# Patient Record
Sex: Female | Born: 1935 | Race: White | Hispanic: No | Marital: Married
Health system: Southern US, Community
[De-identification: ages and names within clinical notes are randomized; demographics above are authoritative.]

---

## 2014-07-29 ENCOUNTER — Inpatient Hospital Stay: Payer: Self-pay | Admitting: Surgery

## 2014-07-29 LAB — CBC WITH DIFFERENTIAL/PLATELET
BASOS PCT: 0.1 %
Basophil #: 0 10*3/uL (ref 0.0–0.1)
Eosinophil #: 0.1 10*3/uL (ref 0.0–0.7)
Eosinophil %: 0.4 %
HCT: 17.6 % — ABNORMAL LOW (ref 35.0–47.0)
HGB: 4.9 g/dL — CL (ref 12.0–16.0)
LYMPHS PCT: 4.8 %
Lymphocyte #: 0.7 10*3/uL — ABNORMAL LOW (ref 1.0–3.6)
MCH: 15.6 pg — ABNORMAL LOW (ref 26.0–34.0)
MCHC: 27.7 g/dL — ABNORMAL LOW (ref 32.0–36.0)
MCV: 56 fL — ABNORMAL LOW (ref 80–100)
MONO ABS: 1.6 x10 3/mm — AB (ref 0.2–0.9)
Monocyte %: 10.3 %
NEUTROS ABS: 13.1 10*3/uL — AB (ref 1.4–6.5)
NEUTROS PCT: 84.4 %
Platelet: 738 10*3/uL — ABNORMAL HIGH (ref 150–440)
RBC: 3.13 10*6/uL — ABNORMAL LOW (ref 3.80–5.20)
RDW: 19.3 % — AB (ref 11.5–14.5)
WBC: 15.5 10*3/uL — AB (ref 3.6–11.0)

## 2014-07-29 LAB — COMPREHENSIVE METABOLIC PANEL
ALK PHOS: 81 U/L
ANION GAP: 9 (ref 7–16)
Albumin: 2.8 g/dL — ABNORMAL LOW (ref 3.4–5.0)
BILIRUBIN TOTAL: 0.4 mg/dL (ref 0.2–1.0)
BUN: 12 mg/dL (ref 7–18)
CHLORIDE: 100 mmol/L (ref 98–107)
Calcium, Total: 8.1 mg/dL — ABNORMAL LOW (ref 8.5–10.1)
Co2: 24 mmol/L (ref 21–32)
Creatinine: 0.84 mg/dL (ref 0.60–1.30)
EGFR (Non-African Amer.): >60
GLUCOSE: 114 mg/dL — AB (ref 65–99)
OSMOLALITY: 267 (ref 275–301)
Potassium: 3.9 mmol/L (ref 3.5–5.1)
SGOT(AST): 31 U/L (ref 15–37)
SGPT (ALT): 16 U/L
Sodium: 133 mmol/L — ABNORMAL LOW (ref 136–145)
TOTAL PROTEIN: 6.9 g/dL (ref 6.4–8.2)

## 2014-07-29 LAB — TROPONIN I

## 2014-07-29 LAB — LIPASE, BLOOD: LIPASE: 49 U/L — AB (ref 73–393)

## 2014-07-30 LAB — BASIC METABOLIC PANEL
Anion Gap: 9 (ref 7–16)
BUN: 12 mg/dL (ref 7–18)
CREATININE: 0.73 mg/dL (ref 0.60–1.30)
Calcium, Total: 8 mg/dL — ABNORMAL LOW (ref 8.5–10.1)
Chloride: 100 mmol/L (ref 98–107)
Co2: 25 mmol/L (ref 21–32)
EGFR (African American): >60
EGFR (Non-African Amer.): >60
Glucose: 138 mg/dL — ABNORMAL HIGH (ref 65–99)
OSMOLALITY: 270 (ref 275–301)
Potassium: 3.6 mmol/L (ref 3.5–5.1)
SODIUM: 134 mmol/L — AB (ref 136–145)

## 2014-07-30 LAB — URINALYSIS, COMPLETE
Bacteria: NONE SEEN
Bilirubin,UR: NEGATIVE
Blood: NEGATIVE
Glucose,UR: NEGATIVE mg/dL (ref 0–75)
Ketone: NEGATIVE
Leukocyte Esterase: NEGATIVE
Nitrite: NEGATIVE
PH: 5 (ref 4.5–8.0)
PROTEIN: NEGATIVE
RBC,UR: 2 /HPF (ref 0–5)
SPECIFIC GRAVITY: 1.02 (ref 1.003–1.030)
WBC UR: 7 /HPF (ref 0–5)

## 2014-07-30 LAB — CBC WITH DIFFERENTIAL/PLATELET
BASOS ABS: 0 10*3/uL (ref 0.0–0.1)
BASOS PCT: 0.1 %
EOS ABS: 0 10*3/uL (ref 0.0–0.7)
Eosinophil %: 0.1 %
HCT: 27.5 % — AB (ref 35.0–47.0)
HGB: 8.6 g/dL — AB (ref 12.0–16.0)
Lymphocyte #: 0.3 10*3/uL — ABNORMAL LOW (ref 1.0–3.6)
Lymphocyte %: 1.7 %
MCH: 21.3 pg — ABNORMAL LOW (ref 26.0–34.0)
MCHC: 31.4 g/dL — AB (ref 32.0–36.0)
MCV: 68 fL — ABNORMAL LOW (ref 80–100)
Monocyte #: 1.7 x10 3/mm — ABNORMAL HIGH (ref 0.2–0.9)
Monocyte %: 8.3 %
NEUTROS ABS: 18.3 10*3/uL — AB (ref 1.4–6.5)
Neutrophil %: 89.8 %
Platelet: 617 10*3/uL — ABNORMAL HIGH (ref 150–440)
RBC: 4.06 10*6/uL (ref 3.80–5.20)
RDW: 32.2 % — AB (ref 11.5–14.5)
WBC: 20.4 10*3/uL — ABNORMAL HIGH (ref 3.6–11.0)

## 2014-07-31 LAB — BASIC METABOLIC PANEL
Anion Gap: 8 (ref 7–16)
BUN: 7 mg/dL (ref 7–18)
CHLORIDE: 107 mmol/L (ref 98–107)
CO2: 22 mmol/L (ref 21–32)
Calcium, Total: 7.5 mg/dL — ABNORMAL LOW (ref 8.5–10.1)
Creatinine: 0.79 mg/dL (ref 0.60–1.30)
EGFR (Non-African Amer.): >60
GLUCOSE: 93 mg/dL (ref 65–99)
Osmolality: 271 (ref 275–301)
POTASSIUM: 3.8 mmol/L (ref 3.5–5.1)
SODIUM: 137 mmol/L (ref 136–145)

## 2014-07-31 LAB — CBC WITH DIFFERENTIAL/PLATELET
BASOS ABS: 0 10*3/uL (ref 0.0–0.1)
BASOS PCT: 0.1 %
Eosinophil #: 0.2 10*3/uL (ref 0.0–0.7)
Eosinophil %: 2 %
HCT: 24.5 % — ABNORMAL LOW (ref 35.0–47.0)
HGB: 7.6 g/dL — ABNORMAL LOW (ref 12.0–16.0)
LYMPHS ABS: 0.7 10*3/uL — AB (ref 1.0–3.6)
Lymphocyte %: 6.4 %
MCH: 21.3 pg — AB (ref 26.0–34.0)
MCHC: 31 g/dL — ABNORMAL LOW (ref 32.0–36.0)
MCV: 69 fL — ABNORMAL LOW (ref 80–100)
MONOS PCT: 12 %
Monocyte #: 1.3 x10 3/mm — ABNORMAL HIGH (ref 0.2–0.9)
NEUTROS ABS: 8.5 10*3/uL — AB (ref 1.4–6.5)
NEUTROS PCT: 79.5 %
Platelet: 591 10*3/uL — ABNORMAL HIGH (ref 150–440)
RBC: 3.56 10*6/uL — ABNORMAL LOW (ref 3.80–5.20)
RDW: 31.5 % — ABNORMAL HIGH (ref 11.5–14.5)
WBC: 10.7 10*3/uL (ref 3.6–11.0)

## 2014-07-31 LAB — MAGNESIUM: MAGNESIUM: 1.9 mg/dL

## 2014-08-01 LAB — CBC WITH DIFFERENTIAL/PLATELET
Basophil #: 0 10*3/uL (ref 0.0–0.1)
Basophil %: 0.4 %
Eosinophil #: 0.3 10*3/uL (ref 0.0–0.7)
Eosinophil %: 3.2 %
HCT: 25.7 % — AB (ref 35.0–47.0)
HGB: 7.9 g/dL — ABNORMAL LOW (ref 12.0–16.0)
Lymphocyte #: 0.7 10*3/uL — ABNORMAL LOW (ref 1.0–3.6)
Lymphocyte %: 6.6 %
MCH: 21.2 pg — ABNORMAL LOW (ref 26.0–34.0)
MCHC: 30.7 g/dL — ABNORMAL LOW (ref 32.0–36.0)
MCV: 69 fL — AB (ref 80–100)
MONOS PCT: 13 %
Monocyte #: 1.3 x10 3/mm — ABNORMAL HIGH (ref 0.2–0.9)
NEUTROS ABS: 7.7 10*3/uL — AB (ref 1.4–6.5)
Neutrophil %: 76.8 %
Platelet: 595 10*3/uL — ABNORMAL HIGH (ref 150–440)
RBC: 3.71 10*6/uL — ABNORMAL LOW (ref 3.80–5.20)
RDW: 31.9 % — AB (ref 11.5–14.5)
WBC: 10.1 10*3/uL (ref 3.6–11.0)

## 2014-08-01 LAB — CEA: CEA: 25.3 ng/mL — AB (ref 0.0–4.7)

## 2014-08-03 LAB — CBC WITH DIFFERENTIAL/PLATELET
Basophil #: 0 10*3/uL (ref 0.0–0.1)
Basophil %: 0.3 %
EOS ABS: 0.4 10*3/uL (ref 0.0–0.7)
EOS PCT: 4.5 %
HCT: 35.3 % (ref 35.0–47.0)
HGB: 11.1 g/dL — ABNORMAL LOW (ref 12.0–16.0)
LYMPHS PCT: 11.2 %
Lymphocyte #: 0.9 10*3/uL — ABNORMAL LOW (ref 1.0–3.6)
MCH: 23.5 pg — AB (ref 26.0–34.0)
MCHC: 31.6 g/dL — AB (ref 32.0–36.0)
MCV: 75 fL — ABNORMAL LOW (ref 80–100)
MONO ABS: 1.3 x10 3/mm — AB (ref 0.2–0.9)
MONOS PCT: 17.4 %
NEUTROS PCT: 66.6 %
Neutrophil #: 5.1 10*3/uL (ref 1.4–6.5)
Platelet: 524 10*3/uL — ABNORMAL HIGH (ref 150–440)
RBC: 4.74 10*6/uL (ref 3.80–5.20)
RDW: 31.3 % — ABNORMAL HIGH (ref 11.5–14.5)
WBC: 7.7 10*3/uL (ref 3.6–11.0)

## 2014-08-03 LAB — COMPREHENSIVE METABOLIC PANEL
Albumin: 2.4 g/dL — ABNORMAL LOW (ref 3.4–5.0)
Alkaline Phosphatase: 69 U/L
Anion Gap: 9 (ref 7–16)
BUN: 2 mg/dL — AB (ref 7–18)
Bilirubin,Total: 1.3 mg/dL — ABNORMAL HIGH (ref 0.2–1.0)
CALCIUM: 7.6 mg/dL — AB (ref 8.5–10.1)
CHLORIDE: 107 mmol/L (ref 98–107)
CO2: 24 mmol/L (ref 21–32)
Creatinine: 0.64 mg/dL (ref 0.60–1.30)
EGFR (African American): >60
EGFR (Non-African Amer.): >60
Glucose: 94 mg/dL (ref 65–99)
OSMOLALITY: 275 (ref 275–301)
POTASSIUM: 3.4 mmol/L — AB (ref 3.5–5.1)
SGOT(AST): 64 U/L — ABNORMAL HIGH (ref 15–37)
SGPT (ALT): 38 U/L
SODIUM: 140 mmol/L (ref 136–145)
TOTAL PROTEIN: 5.8 g/dL — AB (ref 6.4–8.2)

## 2014-08-03 LAB — PROTIME-INR
INR: 1.2
Prothrombin Time: 14.8 secs — ABNORMAL HIGH (ref 11.5–14.7)

## 2014-08-03 LAB — APTT: Activated PTT: 32.2 secs (ref 23.6–35.9)

## 2014-08-04 LAB — CBC WITH DIFFERENTIAL/PLATELET
Basophil #: 0.1 10*3/uL (ref 0.0–0.1)
Basophil %: 0.7 %
EOS ABS: 0.5 10*3/uL (ref 0.0–0.7)
Eosinophil %: 6 %
HCT: 37.7 % (ref 35.0–47.0)
HGB: 11.7 g/dL — AB (ref 12.0–16.0)
LYMPHS PCT: 13 %
Lymphocyte #: 1 10*3/uL (ref 1.0–3.6)
MCH: 23.5 pg — AB (ref 26.0–34.0)
MCHC: 31 g/dL — ABNORMAL LOW (ref 32.0–36.0)
MCV: 76 fL — ABNORMAL LOW (ref 80–100)
MONO ABS: 1.5 x10 3/mm — AB (ref 0.2–0.9)
Monocyte %: 20 %
NEUTROS ABS: 4.6 10*3/uL (ref 1.4–6.5)
Neutrophil %: 60.3 %
Platelet: 511 10*3/uL — ABNORMAL HIGH (ref 150–440)
RBC: 4.99 10*6/uL (ref 3.80–5.20)
RDW: 31.7 % — AB (ref 11.5–14.5)
WBC: 7.7 10*3/uL (ref 3.6–11.0)

## 2014-08-04 LAB — BASIC METABOLIC PANEL
ANION GAP: 5 — AB (ref 7–16)
BUN: 3 mg/dL — ABNORMAL LOW (ref 7–18)
CO2: 26 mmol/L (ref 21–32)
CREATININE: 0.81 mg/dL (ref 0.60–1.30)
Calcium, Total: 8.3 mg/dL — ABNORMAL LOW (ref 8.5–10.1)
Chloride: 106 mmol/L (ref 98–107)
GLUCOSE: 91 mg/dL (ref 65–99)
OSMOLALITY: 270 (ref 275–301)
Potassium: 3.8 mmol/L (ref 3.5–5.1)
Sodium: 137 mmol/L (ref 136–145)

## 2014-08-05 LAB — CBC WITH DIFFERENTIAL/PLATELET
BASOS PCT: 1 %
Basophil #: 0.1 10*3/uL (ref 0.0–0.1)
EOS PCT: 1.6 %
Eosinophil #: 0.2 10*3/uL (ref 0.0–0.7)
HCT: 37.7 % (ref 35.0–47.0)
HGB: 12.1 g/dL (ref 12.0–16.0)
LYMPHS ABS: 1 10*3/uL (ref 1.0–3.6)
Lymphocyte %: 9.5 %
MCH: 23.8 pg — ABNORMAL LOW (ref 26.0–34.0)
MCHC: 32 g/dL (ref 32.0–36.0)
MCV: 74 fL — AB (ref 80–100)
MONOS PCT: 13.2 %
Monocyte #: 1.4 x10 3/mm — ABNORMAL HIGH (ref 0.2–0.9)
NEUTROS ABS: 8.1 10*3/uL — AB (ref 1.4–6.5)
Neutrophil %: 74.7 %
PLATELETS: 472 10*3/uL — AB (ref 150–440)
RBC: 5.07 10*6/uL (ref 3.80–5.20)
RDW: 32.6 % — AB (ref 11.5–14.5)
WBC: 10.8 10*3/uL (ref 3.6–11.0)

## 2014-08-05 LAB — BASIC METABOLIC PANEL
Anion Gap: 9 (ref 7–16)
BUN: 6 mg/dL — ABNORMAL LOW (ref 7–18)
CALCIUM: 7.9 mg/dL — AB (ref 8.5–10.1)
CHLORIDE: 103 mmol/L (ref 98–107)
CREATININE: 1.23 mg/dL (ref 0.60–1.30)
Co2: 25 mmol/L (ref 21–32)
EGFR (Non-African Amer.): 45 — ABNORMAL LOW
GFR CALC AF AMER: 55 — AB
GLUCOSE: 113 mg/dL — AB (ref 65–99)
Osmolality: 272 (ref 275–301)
Potassium: 4.1 mmol/L (ref 3.5–5.1)
Sodium: 137 mmol/L (ref 136–145)

## 2014-08-08 LAB — CBC WITH DIFFERENTIAL/PLATELET
BASOS PCT: 0.5 %
Basophil #: 0 10*3/uL (ref 0.0–0.1)
EOS PCT: 8.1 %
Eosinophil #: 0.6 10*3/uL (ref 0.0–0.7)
HCT: 34.4 % — AB (ref 35.0–47.0)
HGB: 10.5 g/dL — ABNORMAL LOW (ref 12.0–16.0)
Lymphocyte #: 0.9 10*3/uL — ABNORMAL LOW (ref 1.0–3.6)
Lymphocyte %: 11.3 %
MCH: 23.4 pg — AB (ref 26.0–34.0)
MCHC: 30.6 g/dL — ABNORMAL LOW (ref 32.0–36.0)
MCV: 77 fL — AB (ref 80–100)
MONO ABS: 1.1 x10 3/mm — AB (ref 0.2–0.9)
Monocyte %: 14 %
NEUTROS PCT: 66.1 %
Neutrophil #: 5.3 10*3/uL (ref 1.4–6.5)
Platelet: 437 10*3/uL (ref 150–440)
RBC: 4.49 10*6/uL (ref 3.80–5.20)
RDW: 32.9 % — ABNORMAL HIGH (ref 11.5–14.5)
WBC: 8 10*3/uL (ref 3.6–11.0)

## 2014-08-08 LAB — COMPREHENSIVE METABOLIC PANEL
ALBUMIN: 2 g/dL — AB (ref 3.4–5.0)
Alkaline Phosphatase: 78 U/L
Anion Gap: 6 — ABNORMAL LOW (ref 7–16)
BUN: 4 mg/dL — ABNORMAL LOW (ref 7–18)
Bilirubin,Total: 0.4 mg/dL (ref 0.2–1.0)
CHLORIDE: 107 mmol/L (ref 98–107)
Calcium, Total: 8.1 mg/dL — ABNORMAL LOW (ref 8.5–10.1)
Co2: 27 mmol/L (ref 21–32)
Creatinine: 0.72 mg/dL (ref 0.60–1.30)
EGFR (Non-African Amer.): >60
Glucose: 90 mg/dL (ref 65–99)
Osmolality: 276 (ref 275–301)
POTASSIUM: 4 mmol/L (ref 3.5–5.1)
SGOT(AST): 19 U/L (ref 15–37)
SGPT (ALT): 21 U/L
SODIUM: 140 mmol/L (ref 136–145)
Total Protein: 5.2 g/dL — ABNORMAL LOW (ref 6.4–8.2)

## 2014-08-10 LAB — CBC WITH DIFFERENTIAL/PLATELET
Basophil #: 0.1 10*3/uL (ref 0.0–0.1)
Basophil %: 0.7 %
EOS ABS: 0.3 10*3/uL (ref 0.0–0.7)
EOS PCT: 3.4 %
HCT: 34.9 % — AB (ref 35.0–47.0)
HGB: 10.7 g/dL — ABNORMAL LOW (ref 12.0–16.0)
Lymphocyte #: 0.6 10*3/uL — ABNORMAL LOW (ref 1.0–3.6)
Lymphocyte %: 6.5 %
MCH: 23.8 pg — ABNORMAL LOW (ref 26.0–34.0)
MCHC: 30.7 g/dL — AB (ref 32.0–36.0)
MCV: 77 fL — ABNORMAL LOW (ref 80–100)
MONO ABS: 0.8 x10 3/mm (ref 0.2–0.9)
Monocyte %: 9.5 %
NEUTROS PCT: 79.9 %
Neutrophil #: 7.1 10*3/uL — ABNORMAL HIGH (ref 1.4–6.5)
PLATELETS: 433 10*3/uL (ref 150–440)
RBC: 4.52 10*6/uL (ref 3.80–5.20)
RDW: 33.4 % — ABNORMAL HIGH (ref 11.5–14.5)
WBC: 8.8 10*3/uL (ref 3.6–11.0)

## 2014-08-10 LAB — BASIC METABOLIC PANEL
ANION GAP: 4 — AB (ref 7–16)
BUN: 4 mg/dL — AB (ref 7–18)
CALCIUM: 8.4 mg/dL — AB (ref 8.5–10.1)
CHLORIDE: 107 mmol/L (ref 98–107)
CO2: 29 mmol/L (ref 21–32)
Creatinine: 0.71 mg/dL (ref 0.60–1.30)
EGFR (Non-African Amer.): >60
Glucose: 85 mg/dL (ref 65–99)
Osmolality: 276 (ref 275–301)
Potassium: 3.9 mmol/L (ref 3.5–5.1)
Sodium: 140 mmol/L (ref 136–145)

## 2014-08-14 LAB — CBC WITH DIFFERENTIAL/PLATELET
BASOS PCT: 0.3 %
Basophil #: 0 10*3/uL (ref 0.0–0.1)
Eosinophil #: 0.3 10*3/uL (ref 0.0–0.7)
Eosinophil %: 4.4 %
HCT: 33.4 % — ABNORMAL LOW (ref 35.0–47.0)
HGB: 10.5 g/dL — AB (ref 12.0–16.0)
Lymphocyte #: 0.8 10*3/uL — ABNORMAL LOW (ref 1.0–3.6)
Lymphocyte %: 13 %
MCH: 24.2 pg — AB (ref 26.0–34.0)
MCHC: 31.5 g/dL — ABNORMAL LOW (ref 32.0–36.0)
MCV: 77 fL — ABNORMAL LOW (ref 80–100)
Monocyte #: 0.8 x10 3/mm (ref 0.2–0.9)
Monocyte %: 12.4 %
NEUTROS ABS: 4.5 10*3/uL (ref 1.4–6.5)
Neutrophil %: 69.9 %
Platelet: 470 10*3/uL — ABNORMAL HIGH (ref 150–440)
RBC: 4.34 10*6/uL (ref 3.80–5.20)
RDW: 33 % — ABNORMAL HIGH (ref 11.5–14.5)
WBC: 6.4 10*3/uL (ref 3.6–11.0)

## 2014-08-14 LAB — CREATININE, SERUM: Creatinine: 0.65 mg/dL (ref 0.60–1.30)

## 2015-01-13 NOTE — H&P (Signed)
Subjective/Chief Complaint SBO   History of Present Illness Difficult historian, denial. Hx from daughter and son. Brought by them tonight for N/V wt loss, pain in RLQ for three years. Does not see MD. no colonoscopy FH of Lynch Syndrome?? possibly denies melena or hematochezia, denies N/V or pain but family suggests otherwise recent loss of husband does not want me to examine her, does not want surgery   Past History PMH none PSH hysterectomy, tumor removal from abd, no one is sure where it was, ovary vs, colon in 1974 does not see MD   Past Med/Surgical Hx:  Diverticulitis:   Hysterectomy:   ALLERGIES:  No Known Allergies:   Family and Social History:  Family History colon cancer mult members; poss Lynch syndrome   Social History negative tobacco, negative ETOH, ret [employer]   Review of Systems:  Fever/Chills No   Cough No   Sputum No   Abdominal Pain Yes   Diarrhea No   Constipation Yes   Nausea/Vomiting Yes   SOB/DOE No   Chest Pain No   Dysuria No   Tolerating Diet No  Nauseated  Vomiting   ROS ROS not reliable   Medications/Allergies Reviewed Medications/Allergies reviewed   Physical Exam:  GEN no acute distress   HEENT pink conjunctivae   NECK supple   RESP normal resp effort  no use of accessory muscles  rhonchi   CARD regular rate   ABD positive tenderness  no hernia  distended  minimal RLQ tenderness, no peritoneal signs. distended, tense, tympanitic   LYMPH negative neck   EXTR negative edema   SKIN normal to palpation   PSYCH alert, A+O to time, place, person, poor insight, denial   Lab Results: Hepatic:  07-Nov-15 19:06   Bilirubin, Total 0.4  Alkaline Phosphatase 81 (46-116 NOTE: New Reference Range 04/11/14)  SGPT (ALT) 16 (14-63 NOTE: New Reference Range 04/11/14)  SGOT (AST) 31  Total Protein, Serum 6.9  Albumin, Serum  2.8  Routine BB:  07-Nov-15 19:49   Crossmatch Unit 1  Ready  Crossmatch Unit 2 Ready  Crossmatch Unit 3 Issued (Result(s) reported on 29 Jul 2014 at 08:51PM.)  ABO Group + Rh Type O Positive  Antibody Screen NEGATIVE (Result(s) reported on 29 Jul 2014 at 08:38PM.)  Routine Chem:  07-Nov-15 19:06   Result Comment CBC - RESULTS VERIFIED BY REPEAT TESTING. HGB - C/DENIA ROYSTER/1925/07-29-14/RWW  - NOTIFIED OF CRITICAL VALUE  - READ-BACK PROCESS PERFORMED.  Result(s) reported on 29 Jul 2014 at 07:34PM.  Glucose, Serum  114  BUN 12  Creatinine (comp) 0.84  Sodium, Serum  133  Potassium, Serum 3.9  Chloride, Serum 100  CO2, Serum 24  Calcium (Total), Serum  8.1  Osmolality (calc) 267  eGFR (African American) >60  eGFR (Non-African American) >60 (eGFR values <59m/min/1.73 m2 may be an indication of chronic kidney disease (CKD). Calculated eGFR, using the MRDR Study equation, is useful in  patients with stable renal function. The eGFR calculation will not be reliable in acutely ill patients when serum creatinine is changing rapidly. It is not useful in patients on dialysis. The eGFR calculation may not be applicable to patients at the low and high extremes of body sizes, pregnant women, and vegetarians.)  Anion Gap 9  Lipase  49 (Result(s) reported on 29 Jul 2014 at 07:45PM.)  Cardiac:  07-Nov-15 19:06   Troponin I < 0.02 (0.00-0.05 0.05 ng/mL or less: NEGATIVE  Repeat testing  in 3-6 hrs  if clinically indicated. >0.05 ng/mL: POTENTIAL  MYOCARDIAL INJURY. Repeat  testing in 3-6 hrs if  clinically indicated. NOTE: An increase or decrease  of 30% or more on serial  testing suggests a  clinically important change)  Routine Hem:  07-Nov-15 19:06   WBC (CBC)  15.5  RBC (CBC)  3.13  Hemoglobin (CBC)  4.9  Hematocrit (CBC)  17.6  Platelet Count (CBC)  738  MCV  56  MCH  15.6  MCHC  27.7  RDW  19.3  Neutrophil % 84.4  Lymphocyte % 4.8  Monocyte % 10.3  Eosinophil % 0.4  Basophil % 0.1  Neutrophil #  13.1  Lymphocyte #   0.7  Monocyte #  1.6  Eosinophil # 0.1  Basophil # 0.0   Radiology Results: XRay:    07-Nov-15 19:19, Chest PA and Lateral  Chest PA and Lateral  REASON FOR EXAM:    epigastric pain  COMMENTS:       PROCEDURE: DXR - DXR CHEST PA (OR AP) AND LATERAL  - Jul 29 2014  7:19PM     CLINICAL DATA:  Patient is having epigastric pain, nausea and  vomiting starting 11/06. She is weak and not able to stand for long  periods. She has had nothing to eat today. Hx diverticilitis    EXAM:  CHEST  2 VIEW    COMPARISON:  None.    FINDINGS:  Cardiac silhouette is mildly enlarged. Normal mediastinal and hilar  contours.    Mildly thickened interstitial markings diffusely. No lung  consolidation or edema. No pleural effusion or pneumothorax.    Bony thorax is demineralized but intact.    Included portions of the upper abdomen show mild small bowel  dilation with multiple air-fluid levels. This may reflect an  adynamic ileus/gastroenteritis. No free air.     IMPRESSION:  1. No acute cardiopulmonary disease.  2. Mild small bowel dilation with multiple air-fluid levels.  Possible etiologies include adynamic ileus, gastroenteritis and low  grade partial obstruction.      Electronically Signed    By: Lajean Manes M.D.    On: 07/29/2014 19:31         Verified By: Lasandra Beech, M.D.,  CT:    07-Nov-15 22:32, CT Abdomen and Pelvis With Contrast  CT Abdomen and Pelvis With Contrast  REASON FOR EXAM:    (1) abd pain, leukocytosis, sv anemia; (2) abd pain,   leukocytosis, sv anemia  COMMENTS:       PROCEDURE: CT  - CT ABDOMEN / PELVIS  W  - Jul 29 2014 10:32PM     CLINICAL DATA:  Elevated white blood cell count. Low hemoglobin.  Surgical history of appendectomy and hysterectomy. Nausea and  vomiting for 2 days.    EXAM:  CT ABDOMEN AND PELVIS WITH CONTRAST    TECHNIQUE:  Multidetector CT imaging of the abdomen and pelvis was performed  using the standard protocol following  bolus administration of  intravenous contrast.    CONTRAST:  75 mL Isovue-300    COMPARISON:  None    FINDINGS:  Lower chest:  Lung bases are clear.  No pericardial fluid.    Hepatobiliary: Low-density lesion in the posterior right hepatic  lobe (segment6) measuring 8 mm on image 23, series 2. Adjacent  smaller lesion measures 6 mm on image 22. There are 2 large  gallstones within the lumen of the gallbladder measuring 10 mm each.  There is  no gallbladder inflammation.  Pancreas: Pancreas is normal.    Spleen: Normal spleen.    Adrenals/urinary tract: Adrenal glands and kidneys are normal.  Ureters and bladder are normal.    Stomach/Bowel: There is a large volume of contrast within the  stomach. Contrast refluxes through the diaphragm into a moderate  sized hiatal hernia. There is dilatation of the proximal small  bowel. There is poor progression of the oral contrast through the  small bowel. The distal small bowel is fluid-filled. Dilated loops  of small bowel measure up to 3 cm.    Within the right lower quadrant there is irregular nodular  thickening above the cecum. This is at the level of the terminal  ileum. The small bowel is dilated right up to the terminal ileum.  This thickening can be seen on coronal image 53, series 2. This  thickened tissue is ill-defined and measure approximate 2.5 x 2.5 cm  on image 53, series 2. There are 2 calcifications within the tip of  the cecum measuring 15 and 8 mm on image 55. The abnormal soft  tissue thickening extends along the medial border of the ascending  colon on image 47, series 2. There an extra luminal component to  this abnormal tissue in the right lower quadrant on image 53  measuring 2.3 cm. There is fluid in the ascending colon. The  transverse colon is difficult to define descending colon is  fluid-filled the or rectosigmoid colon is predominantly collapsed.    Abdominal aorta is normal caliber  Vascular/Lymphatic:  Abdominal aorta is normal caliber. There is no  retroperitoneal or periportal lymphadenopathy. No pelvic  lymphadenopathy. No peritoneal disease.    Reproductive: Post hysterectomy anatomy. No free fluid the pelvis  per    Musculoskeletal: No aggressive osseous lesion.    Other: No intraperitoneal free air.  No pneumatosis     IMPRESSION:  1. Mechanical small bowel obstruction with obstruction occurring at  the terminal ileum. This is not high-grade acute obstruction but  more slowly progressive.  2. Irregular thickened tissue at the terminal ileum extending along  the medial border this cecum is concerning for a neoplastic process.  Colorectal carcinoma carcinoma is primary consideration. Appendiceal  carcinoma would be a secondary consideration. Prior appendectomy.  3. Two calcifications within the cecum. Do not favor these to  represent gallstones as there is no gas in the gallbladder to  suggest gallstones ileus pattern.  4. Collapsed colon consistent with obstruction.  5. Two low-density lesion in the right hepatic lobe are  indeterminate but are concerning for hepatic metastasis in the face  of the potential neoplastic process in the cecum.  6. Management strategy may include NG tube to decompress the bowel  and colonoscopy to evaluate the cecum.  Findings conveyed Antonietta Barcelona, MDon 07/29/2014  at23:00.  Electronically Signed    By: Suzy Bouchard M.D.    On: 07/29/2014 23:01         Verified By: Rennis Golden, M.D.,    Assessment/Admission Diagnosis SBO in RLQ likely sec to colon mass, strong family hx. pt is noncompliant and in denial of any medical problems may not consent to surgery profound anemia and malnutrition with low albumin needs transfusions (started by ER MD) TPN preop? NG decompression, surgical planning VTE prophylaxis, plt 750k over one hour spent discussed with PD and ER MDs   Electronic Signatures: Florene Glen (MD)  (Signed  [phone] 23:59)  Authored: CHIEF COMPLAINT and HISTORY, PAST  MEDICAL/SURGIAL HISTORY, ALLERGIES, FAMILY AND SOCIAL HISTORY, REVIEW OF SYSTEMS, PHYSICAL EXAM, LABS, Radiology, ASSESSMENT AND PLAN   Last Updated: 07-Nov-15 23:59 by Florene Glen (MD)

## 2015-01-13 NOTE — H&P (Signed)
PATIENT NAME:  Briana Roth, Briana Roth MR#:  [id number] DATE OF BIRTH:  [birth year]  DATE OF ADMISSION:  07/29/2014  CHIEF COMPLAINT: Abdominal pain.   HISTORY OF PRESENT ILLNESS: This is an extremely poor historian. In fact, throughout the interview, she denies even having any sort of medical problems, but the "real story" is told by her son and daughter who live approximately an hour away from their mother, who recently lost her husband. According to the family the patient has been ill for about 3 years. They have a strong family history of possibly Lynch syndrome, in that they have had multiple members with colon cancer. No ovarian or breast cancer, but the son stated that he had a family tree done at one point and was told that he had some sort of syndrome and when I mentioned Lynch, he said "that's it."   When asked why the patient came to the Emergency Room tonight, she states that it was because her son brought her. Her son states it was because she had been throwing up for at least 24 hours, probably all day and all night, has not been eating very well, has been frequently constipated, has been losing weight, and has had abdominal pain. The abdominal pain has not worsened, but has been present for 3 years and the patient is holding her right lower quadrant. She does not see physicians; has never had a colonoscopy; does not take any medications. She denies melena, hematochezia, or weight loss, or nausea or vomiting, but actively vomits in front of me, and all of her story is contradicted by the son and daughter, who cannot help me with an accurate review of systems either.   Additional history suggests that the son called an ambulance and the patient refused to get in the ambulance earlier today. A second ambulance was called and she consented and was brought here basically against her wishes and tells me that she would not want to have any sort of surgery at this point or at least consider it at this point.  She is being actively transfused as seen in the ER.   PAST MEDICAL HISTORY: None, but the patient takes no medications and sees no physicians regularly at this time.   PAST SURGICAL HISTORY: Hysterectomy; unsure if her ovaries were removed. She had a subsequent operation for a "tumor," but is not sure and very vague about whether or not this was in the colon or in the ovary and the family is not sure either.   ALLERGIES: None.   MEDICATIONS: None.   FAMILY HISTORY: Suggestive of possible Lynch syndrome. Burnadette Peter was suggested by me when the son stated that he had had a family tree performed and a syndrome was identified. He recognized the word Lynch. No history of breast or ovarian cancer, but multiple members with colon cancer.   SOCIAL HISTORY: The patient does not smoke or drink. Has never been a heavy smoker or drinker and is a retired Public librarian.   REVIEW OF SYSTEMS: Performed. All 10 systems are negative with the exception that I  mentioned in the HPI, but again this review of systems is not very reliable in that the patient does not cooperate very well and the family is not able to assist with an accurate review of systems as they do not live with the patient. Unclear how much weight loss the patient has had over the last 3 years, but probably worsening in the last 6 months.   PHYSICAL EXAMINATION:  GENERAL: Chronically ill-appearing female patient in no acute distress. BMI of 21, weight 115 pounds, 62 inches tall.  VITAL SIGNS: Temperature of 99.2, pulse of 80, respirations 15, blood pressure 142/51. Pain scale of 0 to 2, was 7 on admission, and the patient is actively holding her right lower quadrant.  HEENT: No scleral icterus. Pallid mucous membranes.  NECK: No palpable neck nodes.   CHEST: Shows mild rhonchi bilaterally. No acute respiratory signs.  ABDOMEN: Distended, tympanitic, very tense, but there is no guarding, no rebound, no percussion tenderness. Minimal tenderness in  the right lower quadrant. A midline scar is noted without hernia.  EXTREMITIES: Show no edema.  NEUROLOGIC: Grossly intact.  INTEGUMENT: Shows no jaundice.   IMAGING: CT scan is personally reviewed, suggestive of a small bowel obstruction at the ileocecal valve with a probable mass involving the cecum and the retroperitoneum. KUB is also personally reviewed.   LABORATORY DATA: On admission, the patient's H and H were 4.9 and 17.6 with a platelet count of 738,000, an MCV of 56, and a white blood cell count of 15.5, and she is being actively transfused. Lipase was 49, serum albumin was 2.8 with a total protein of 6.9. Sodium was 133, calcium of 8.1, otherwise normal electrolytes.   ASSESSMENT AND PLAN: This is a very difficult historian who has been denying any abdominal symptoms, but was brought by her son in an ambulance to the Emergency Room for 3 years of abdominal pain with recent nausea, vomiting, weight loss worsening over the last few months, and abdominal pain that has been going on for several years, and no clear indication that it is any worse today than it has been for at least the recent timeframe. She is in no acute distress at this point; however, she does show signs of a small bowel obstruction, likely secondary to colon cancer. She is being actively transfused by the Emergency Room physician. I have recommended admission to the hospital with a nasogastric tube, which the patient does not want, but I suggested that it would be the only way to give her any sort of relief from this distention. It would make her feel better and, hopefully, prevent any sort of perforation if distention were to become worse.   I also discussed with she and her family the fact that she will almost certainly require surgery. She stated that she did not want any surgery now, but was unclear as to whether she would ever consent to any surgery at all. I have asked [facility] to see the patient as well and discuss with  them directly as with the Emergency Room physician this patient's care and concerns. The  family was in agreement with admission. She may require or benefit from some sort of nutritional support preoperatively because her albumin is so low, as is her hemoglobin and hematocrit. Venous thromboembolic prophylaxis will be necessary because of a high platelet count also suggestive of a possible paraneoplastic condition.   Over an hour was spent reviewing films and working with the patient and family and physicians on this patient's care, and I will discuss her with Dr. Michela PitcherEly, who will be taking care of her on the day shift.    ____________________________ Adah Salvageichard E. Excell Seltzerooper, MD rec:ts D: 07/30/2014 00:11:08 ET T: 07/30/2014 01:10:39 ET JOB#: [id number]  cc: Adah Salvageichard E. Excell Seltzerooper, MD, <Dictator> Lattie HawICHARD E Lorrie Strauch MD ELECTRONICALLY SIGNED 07/30/2014 20:11

## 2015-01-13 NOTE — Consult Note (Signed)
Brief Consult Note: Diagnosis: severe anemia.   Patient was seen by consultant.   Consult note dictated.   Comments: 78yCF w/o pmh - no pcp/follow up, p/w n/v abd pain apparently had RLQ for 2-3 years intermittently however never followed up now 1d duration N/V, non bloody emesis, abd pain - described only as pain, unable to rate pain further work up in ED revealed hgb 4.9 family/pt attest to weakness and doe for 6 months, no cp, brbpr, melena found to have sbo on CT - likely neoplastic process - pt eval/admitted by gen surgery  1. preoperative eval for SBO: once hgb corrected, she could be considered moderate risk for moderate risk surgery from cardiac standpoint. even despite hgb 4.9 has no cp or cardiac symptoms, no further testing required prior to surgery after correction of hgb  2. profound microcytic anemia: would suspect this is related to gi malignancy, had received 1 unit prbc thus far, has 1 more ordered. follow cbc, goal hgb>7  3. leukocytosis: no evidence of infection, no indication for abx 4. thrombocytosis: liekly reactive given anemia 5. vte px: hep subcutaneous per gen surg [phone].  Electronic Signatures: Jalon Squier, Cletis Athensavid K (MD)  (Signed [phone]-Nov-15 01:54)  Authored: Brief Consult Note   Last Updated: 08-Nov-15 01:54 by Wyatt HasteHower, Tykera Skates K (MD)

## 2015-01-13 NOTE — Consult Note (Signed)
PATIENT NAME:  Briana Roth, Briana Roth MR#:  [id number] DATE OF BIRTH:  1935/12/19  DATE OF CONSULTATION:  07/31/2014  REFERRING PHYSICIAN:   CONSULTING PHYSICIAN:  Ezzard StandingPaul Y. Tai Syfert, MD  REASON FOR REFERRAL: Bowel obstruction, liver metastases.   HISTORY OF PRESENT ILLNESS: The patient is [age] year old white female, who was admitted 2 nights ago with abdominal pain mainly in the right lower quadrant area. She lost her husband recently. She is an extremely poor historian. There is a possible family history of Lynch syndrome where other members have colon cancer. When she was admitted, CT scan showed probable liver metastases with distal small bowel obstruction and an abnormal cecal and ascending colon area where a tumor may be present. She has never had a colonoscopy. She was not interested initially on having one done. She did not have any gross hematochezia or melena, or weight loss; however, her history is very unreliable at this time. They did put an NG in for suction. She did not have that much drainage from the NG. She did say that she was having some liquid stools now.   PAST MEDICAL HISTORY:  She has no prior medical history. There is no medication. She does not see doctors usually. She has had a hysterectomy in the past.   ALLERGIES: She has no known drug allergies.   FAMILY HISTORY: Suggestive of Lynch syndrome. She does not smoke or drink. She lives alone.   REVIEW OF SYSTEMS: Negative because she basically denies everything. No family members are available.   PHYSICAL EXAMINATION:  GENERAL: The patient is chronically-ill appearing. She is thin and wasted.  HEAD AND NECK: Within normal limits.  CARDIAC: Reveals regular rhythm and rate.  LUNGS: Clear.  ABDOMEN: Shows somewhat distended. It is mildly tender, but more in the right lower quadrant area. There is no hepatomegaly.  EXTREMITIES: Show no edema.   LABORATORY AND DIAGNOSTIC DATA: CT scan shows distal small bowel obstruction at the ileocecal  valve with a probable mass involving the cecal area.   Her hemoglobin was only 4.9, hematocrit 17.6, platelet count 738,000. MCV 56. White count was 15.5. The rest of the electrolytes are okay.   ASSESSMENT AND PLAN: This is a patient who appears to have colon cancer with liver metastases already. Colon cancer may be causing a small bowel obstruction. The patient has microcytic anemia, probably from chronic blood loss. I discussed the possibility of doing a colonoscopy to get tissue samples; however, I am a little concerned that I may not be able to adequately prep her for us to do an adequate colonoscopy, to get to the right side of the colon and visualize the area well. I will order a two way abdominal series to see how things look. If the small bowel obstruction can be resolved, then we will try to prep her either from above or from below, although the tap water enema usually does not reach the rest of the colon very well. If the obstruction does not resolve, then she will have to go surgery eventually.   Thank you for the referral.    ____________________________ Ezzard StandingPaul Y. Bluford Kaufmannh, MD pyo:JT D: 07/31/2014 11:38:44 ET T: 07/31/2014 11:52:35 ET JOB#: [id number]  cc: Ezzard StandingPaul Y. Bluford Kaufmannh, MD, <Dictator> Ezzard StandingPAUL Y Aibhlinn Kalmar MD ELECTRONICALLY SIGNED 08/03/2014 10:06

## 2015-01-13 NOTE — Consult Note (Signed)
Pt seen and examined. Poor historian. Pt with chronic microcytic anemia, RLQ abdominal pain, and distal SB obstruction with probable cecal mass and liver mets by CT scan. Has NG for suction. No bowel movements. If obstruction is partially relieved, could try scheduling colonoscopy if patient can be adequately prepped to get tissue biopsies. However, if obstruction is not relieved, then patient will have to go to surgery eventually. Will follow. Thanks.   Electronic Signatures: Lutricia Feilh, Rodolfo Notaro (MD) (Signed on 09-Nov-15 07:53)  Authored   Last Updated: 09-Nov-15 07:56 by Lutricia Feilh, Sevanna Ballengee (MD)

## 2015-01-13 NOTE — Consult Note (Signed)
Brief Consult Note: Diagnosis: colon cancer stage 4, liver metastasis on CT.   Patient was seen by consultant.   Consult note dictated.   Comments: PATIENT SEEN 11/19, CONSULT DICTATED TODAY.Marland Kitchen.ALSO SPOKE WITH DAUGHTER, AND SON, SEPARATELY.Dellia Cloud.  [facility] APPT IN [facility] IN APPROX 3 WEEKS.  INITIAL F/U WOULD INCLUDE PET/CT AND REPEAT CEA IN APPROX 3 WEEKS. PATIENT CURRENTLY UNDECIDED RE TAKING ANY CANCER TXS, AND ALSO MAY MOVE TO [place], AND MAY SEEK F/U CARE OUT OF TOWN.  Electronic Signatures: Marin RobertsGittin, Robert G (MD)  (Signed [phone]-Nov-15 09:04)  Authored: Brief Consult Note   Last Updated: 20-Nov-15 09:04 by Marin RobertsGittin, Robert G (MD)

## 2015-01-13 NOTE — Consult Note (Signed)
PATIENT NAME:  Briana Roth, Briana Roth MR#:  [id number] DATE OF BIRTH:  Apr 11, 1936  DATE OF CONSULTATION:  07/30/2014  REFERRING PHYSICIAN:  Dr. Excell Seltzer CONSULTING PHYSICIAN:  Cletis Athens. Bernell Sigal, MD  PRIMARY CARE PHYSICIAN: None.   REASON FOR CONSULTATION: Anemia.   HISTORY OF PRESENT ILLNESS: This is a 79 year old Caucasian female who originally presented to the hospital on 07/29/2014 with symptoms of nausea, vomiting, and abdominal pain. She has no significant past medical history, as she follows with no PCP. Apparently, she has been complaining of right lower quadrant abdominal pain for 2- to 3-year duration which has been intermittent; however, never followed up for any further medical care. Now has 1- to 2-day duration of nausea, vomiting, nonbloody, nonbilious emesis with associated abdominal pain. The patient is a poor historian and was unwilling to provide much information. It was also obtained from her family who are currently at bedside. Describes the abdominal pain only as "pain." Unable to further rate pain or quantify. Workup in the Emergency Department revealed hemoglobin of 4.9 as well as a small-bowel obstruction. The patient was seen and evaluated by general surgery, who promptly admitted her to their service and asked for consult, given anemia and preoperative evaluation. The patient and family do attest that she has been having some weakness as well as dyspnea on exertion for at least 6 months in total duration; however, denies any chest pain or anginal symptoms, bright red blood per rectum or melena or lightheadedness or further shortness of breath. Currently, she has no complaints at this time other than "I don't want to talk to any more doctors."  REVIEW OF SYSTEMS: CONSTITUTIONAL: Positive for fatigue, weakness. Denies fevers, chills.  EYES: Denies blurry vision, double vision, or eye pain.  EARS, NOSE, THROAT: Denies tinnitus, ear pain, hearing loss.  RESPIRATORY: Denies cough, wheeze,  shortness of breath.  CARDIOVASCULAR: Denies chest pain, palpitations, edema.  GASTROINTESTINAL: Positive for nausea, vomiting, abdominal pain as described above. Denies any diarrhea or constipation.  GENITOURINARY: Denies dysuria or hematuria.  ENDOCRINE: Denies nocturia or thyroid problems. HEMATOLOGY AND LYMPHATIC: Denies easy bruising and bleeding.  SKIN: Denies rashes or lesions.  MUSCULOSKELETAL: Denies pain in neck, back, shoulder, knees, hips, or arthritic symptoms.  NEUROLOGIC: Denies paralysis, paresthesias.  PSYCHIATRIC: Denies anxiety or depressive symptoms.  Otherwise, full review of systems performed by me is negative.   PAST MEDICAL HISTORY: None.   SOCIAL HISTORY: No alcohol, tobacco, or drug usage.   FAMILY HISTORY: Positive for multiple family members with colon cancer.   ALLERGIES: None.   HOME MEDICATIONS: None.  PHYSICAL EXAMINATION:  VITAL SIGNS: Temperature 98.7, heart rate 80, respirations 16, blood pressure 154/57, saturating 95% on room air. Weight 52.2 kg, BMI 21.1.  GENERAL: Ill- and frail-appearing Caucasian female who is currently in no acute distress.  HEAD: Normocephalic, atraumatic.  EYES: Pupils equal, round, reactive to light. Extraocular muscles intact. No scleral icterus. Pale conjunctivae.  MOUTH: Dry mucosal membranes. Dentition intact. No abscess noted. EARS, NOSE, THROAT: Clear without exudates. No external lesions.  NECK: Supple. No thyromegaly. No nodules. No JVD.  PULMONARY: Clear to auscultation bilaterally without wheezes, rales, or rhonchi. No use of accessory muscles. Good respiratory rate.  CHEST: Nontender to palpation.  CARDIOVASCULAR: S1, S2, regular rate and rhythm. No murmurs, rubs, or gallops. No edema. Pedal pulses 2+ bilaterally. GASTROINTESTINAL: Soft. Minimal tenderness to palpation over the right lower quadrant; however, no rebound, guarding, or motion tenderness. Hypoactive bowel sounds. MUSCULOSKELETAL: No swelling,  clubbing, or edema. Range  of motion full in all extremities.  NEUROLOGIC: Cranial nerves II through XII intact. No gross focal neurological deficits. Sensation intact. Reflexes intact.  SKIN: No ulceration, lesions, rashes, or cyanosis. Skin warm and dry. Markedly pale. PSYCHIATRIC: Mood, affect blunted. She is, however, awake, alert, oriented x 3. Insight and judgment appear to be intact.   LABORATORY DATA: Sodium 133, potassium 3.9, chloride 100, bicarbonate 24, BUN 12, creatinine 0.84, glucose 114, albumin of 2.8. Otherwise, LFTs within normal limits. WBC 15.5, hemoglobin of 4.9, MCV of 56, RDW of 19.3, platelets of 738,000. Urinalysis negative for evidence of infection.  IMAGING: Chest x-ray performed reveals no acute cardiopulmonary process. CT, abdomen and pelvis, performed which reveals a mechanical small-bowel obstruction occurring at the terminal ileum; however, which appears to be slowly progressive. There is also irregular thickened tissue at the terminal ileum extending along the medial border of the cecum concerning for a neoplastic process. Also, a mention of 2 low-density lesions in the right hepatic lobe which are of indeterminate etiology; however, concerning for hepatic metastases.   ASSESSMENT AND PLAN: This is a 79 year old Caucasian female without significant past medical history, as she has no followup with any medical care for numerous years, presenting with nausea, vomiting, abdominal pain. She was found to have a small-bowel obstruction, likely neoplastic in etiology, as well as profound anemia, hemoglobin of 4.9. 1.  Preoperative evaluation for small-bowel obstruction. Once hemoglobin is corrected, she should be considered a moderate risk for moderate risk surgery from a cardiac standpoint. Despite having hemoglobin of 4.9, she has no chest pain, anginal, or cardiac symptoms. She has no evidence of a congestive heart failure, severe valvular dysfunction, or severe arrhythmias. No  further testing will be required prior to surgery after the correction of her anemia.  2.  Profound microcytic anemia with MCV in the 50s, elevated RDW. I suspect this is related to gastrointestinal malignancy with chronic bleeding. She has received 1 unit of packed red blood cells thus far, has been ordered to receive 1 more unit. Will follow CBCs. Goal hemoglobin will be greater than 7.  3.  She also has a CEA which has been ordered. Will follow up on this. 4.  Leukocytosis. No evidence of infection at this time. No indications for antibiotics.  5.  Thrombocytosis, likely reactive given her profound anemia.  6.  Venous thromboembolism prophylaxis with heparin subcutaneously per general surgery.  CODE STATUS: The patient is full code.   TIME SPENT: 45 minutes in discussing with the patient and family at bedside.    ____________________________ Cletis Athensavid K. Peirce Deveney, MD dkh:ST D: 07/30/2014 01:51:23 ET T: 07/30/2014 02:15:51 ET JOB#: [id number]  cc: Cletis Athensavid K. Natthew Marlatt, MD, <Dictator> Derrian Rodak Synetta ShadowK Bettyjane Shenoy MD ELECTRONICALLY SIGNED 07/31/2014 0:23

## 2015-01-13 NOTE — Consult Note (Signed)
Pt with large mass near hepatic flexure. Bx'd and tattooed. Large, obstructing mass in cecal area. Also, biopsied. Likely colon cancer. Will need to remove both masses. Will sign off. Thanks.  Electronic Signatures: Lutricia Feilh, Rithvik Orcutt (MD)  (Signed on 10-Nov-15 13:36)  Authored  Last Updated: 10-Nov-15 13:36 by Lutricia Feilh, Brannen Koppen (MD)

## 2015-01-13 NOTE — Op Note (Signed)
PATIENT NAME:  Briana Roth, BOYE MR#:  [id number] DATE OF BIRTH:  08-03-1936  DATE OF PROCEDURE:  08/04/2014  OPERATION PERFORMED: Ileocolectomy, with excision of a portion of the right psoas muscle and right genitofemoral nerve.   PREOPERATIVE DIAGNOSIS: Right colon cancer.   POSTOPERATIVE DIAGNOSIS: Right colon cancer, invading psoas muscle and retroperitoneum.   SURGEON: Claude Manges, MD  FIRST ASSISTANT: Hulda Marin, MD   ANESTHESIA: General.   PROCEDURE IN DETAIL: The patient was placed supine on the Operating Room table and prepped and draped in the usual sterile fashion. An incision was made in the midline and carried down through the patient's previous lower midline incision, and this was carried through the subcutaneous tissue and the linea alba and the peritoneum was entered carefully. There was a single adhesion from the tip of the omentum down to the pelvis, and this was divided with the electrocautery. There were no peritoneal implants in the pelvis and the liver was palpated bimanually and there were no large liver metastases. I did not inspect the liver visually and I did not perform an intraoperative ultrasound, however. The patient's tumor was quite bulky and it was fixed to the retroperitoneum. The hepatic flexure was taken down with the electrocautery and a Kocher maneuver. A partial Kocher maneuver was performed and the right colon and mesocolon were freed off of the duodenum up to a level of the proximal transverse colon. Here, the omentum was divided such that the vast majority of the omentum was left in the patient, but this omental division was taken up to the area of planned division. The mesocolon was divided here at the colon level and the colon was divided with the GIA stapling device, and then the mesocolonic excision was carried down to the retroperitoneum to include the right colic vessels. These were ligated with a 2-0 silk suture ligatures. The dissection was  carried down on top of Gerota's fascia to a level just below the lower pole of the right kidney and there the mesenteric shortening and the mass that was going into the retroperitoneum was quite problematic. Therefore, the terminal ileum was divided with the GIA stapling device and the terminal ileal mesentery was divided with the Harmonic scalpel and this was carried down to the retroperitoneum where retroperitoneal excision was performed because there were some apparent tumor nodules outside the main mass.  This was done in such a fashion that it was en bloc so that they were not separate specimens. The root of the mesentery was involved in the mass, but only the most dorsal portion of it and this was divided with the Harmonic scalpel leaving the remainder of the vessels to the remainder of the small intestine intact. The ileocolic vessels were encountered here and they were huge. They were divided and ligated with 2-0 silk suture ligatures. This left the specimen attached to the retroperitoneum and the dissection was begun laterally along what would have been the white line of Toldt to include retroperitoneal tissue up to the border of the psoas muscle. The ureter was identified proximally just as it emanated near the lower pole of the kidney and the right ovarian vessels were ligated and divided, but fortunately the ureter could be dissected free from the tumor mass. This required additional dissection of the second and third portions of the duodenum to mobilize it away from the mass and then the mass itself was left attached only to the psoas muscle.  Portion of the psoas muscle fascia and  a few muscle fibers were excised and the genitofemoral nerve was directly involved and a portion of that was excised as well.  The specimen was delivered off the table and hemostasis was achieved with electrocautery and then a side-to-side stapled anastomosis was performed from the antimesenteric surface of the terminal ileum  to the mid transverse colon, on a tenia coli.  This was done with a GIA stapling device and the resultant enterotomy was closed with TA-60 stapler.  A 3-0 silk seromuscular Lembert suture was placed where the 2 staple lines crossed and another was placed at the apex of the GIA staple line. The mesenteric defect was not closed. The wound was irrigated with warm normal saline and this was suctioned clear. The small intestine was replaced in its anatomic position with the omentum draped over top of it and everyone changed gowns and gloves and a new sterile field was brought to the table and an additional irrigation was performed. When sponge and needle counts were correct. The fascia was closed with a running #1 PDS suture. The subcutaneous tissue was irrigated and the skin was reapproximated with a skin stapling device.  A sterile dressing was applied. The patient tolerated the procedure well. There were no complications.      ____________________________ Claude MangesWilliam F. Westlynn Fifer, MD wfm:DT D: 08/04/2014 12:19:30 ET T: 08/04/2014 15:28:43 ET JOB#: [id number]  cc: Claude MangesWilliam F. Suhaila Troiano, MD, <Dictator> Claude MangesWILLIAM F Kalayna Noy MD ELECTRONICALLY SIGNED 08/04/2014 17:22

## 2015-01-13 NOTE — Consult Note (Signed)
Asked to schedule colonoscopy either today or tomorow. Pt apparently pulled NG out on her own. Some liquid stools. However, repeat abdominal series show persistent distal SBO. It is unlikely that I will have an adequate view of colon today without bowel prep. Tap water enemas are unlikely to clean right side of colon well. Will try some golytely prep today to see if we can prep her. Hopefully, bowel obstruction would not worsen. If we can clean her, then I will try to schedule colonoscopy by tomorrow. Thanks.   Electronic Signatures: Lutricia Feilh, Charlisa Cham (MD)  (Signed on 09-Nov-15 11:59)  Authored  Last Updated: 09-Nov-15 11:59 by Lutricia Feilh, Lorre Opdahl (MD)

## 2015-01-13 NOTE — Discharge Summary (Signed)
PATIENT NAME:  Briana Roth, Briana Roth MR#:  [id number] DATE OF BIRTH:  [birth year]  DATE OF ADMISSION:  07/29/2014 DATE OF DISCHARGE:  08/14/2014  PRINCIPLE DIAGNOSES:  1.  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  2.  Severe anemia.   PRINCIPAL PROCEDURE PERFORMED DURING THIS ADMISSION: On 08/04/2014, ileocolectomy with excision of a portion of the right psoas muscle and right genitofemoral nerve.   OTHER PROCEDURES PERFORMED DURING THIS ADMISSION: Colonoscopy on 08/01/2014.   HOSPITAL COURSE: The patient was admitted to the hospital and transfused for her presenting hemoglobin of approximately 5. She was undecided regarding surgery for quite a few days but ultimately acquiesced and underwent colonoscopy, which cleared the remainder of her colon. She had a family history that was consistent with non-polyposis hereditary colon cancer and underwent the above-mentioned procedure for the above-mentioned diagnosis and after a preoperative CEA of 25 was obtained. Her preoperative CT scan showed 2 subcentimeter lesions in segment 6 of the liver consistent with liver metastases. Her pathology returned with a 2 of 25 positive lymph nodes, a very close or positive radial margin and her stage was T4a, N1b, MX (R0).   Postoperatively she did well and her diet was advanced and she was ready for discharge on postoperative day 10. She was only taking Norco for pain and she had been seen by Dr. Lorre NickGittin from hematology/oncology in consultation. The patient was undecided about postoperative chemotherapy or radiotherapy but was likely going to decline both of these. She was given an appointment to see me in follow up and asked to call the office in the interim for any problems.     ____________________________ Claude MangesWilliam F. Nandi Tonnesen, MD wfm:AT D: 08/16/2014 23:01:44 ET T: 08/16/2014 23:48:42 ET JOB#: [id number]  cc: Claude MangesWilliam F. Tajai Suder, MD, <Dictator> Knute Neuobert G. Lorre NickGittin, MD Claude MangesWILLIAM F Daianna Vasques  MD ELECTRONICALLY SIGNED 08/25/2014 20:03

## 2015-01-15 LAB — SURGICAL PATHOLOGY

## 2015-01-17 NOTE — Consult Note (Signed)
PATIENT NAME:  Briana Roth, Briana Roth MR#:  [id number] DATE OF BIRTH:  Dec 31, 1935  DATE OF CONSULTATION:  08/10/2014  REFERRING PHYSICIAN:   CONSULTING PHYSICIAN:  Knute Neu. Gittin, MD  HISTORY OF PRESENT ILLNESS: Briana Roth is a 79 year old patient who was admitted on November 7th and has had surgical and medical evaluation, ultimately surgery. The patient had had an illness that had gone on for a few years without seeking medical attention with abdominal pain and discomfort. She was hospitalized because of vomiting and increasing abdominal pain. There was no melena or hematochezia. Denied any weight loss. The patient was on no regular medications, has no regular physicians. Has a history of hysterectomy. It was unknown if the ovaries had been removed.   ALLERGIES: No allergies.  MEDICATIONS: No regular medications.   FAMILY HISTORY: Lynch syndrome.   SOCIAL HISTORY: No alcohol, no tobacco.   REVIEW OF SYSTEMS: On system review, when I saw the patient, which is days post surgery for what ultimately was an obstructing colon cancer, the patient was comfortable, was not at the time of exam having abdominal pain, denied headache or dizziness. No chills or sweats. No cough or wheezing. No hemoptysis. No shortness of breath. Had been up, has general weakness, has required assistance to move in the room. Denies dysuria, hematuria. Stated that she did have a bowel movement for the first one earlier in the day and was tolerating the p.o. diet. No edema. No rash. No bruising. No pruritus.   PHYSICAL EXAMINATION: The patient on initial evaluation was certainly ill-appearing with a distended and tympanitic and tense abdomen.   DIAGNOSTIC DATA: Initial imaging: Chest x-ray was unremarkable, except for loops of bowel in the abdominal portion. CT of abdomen and pelvis showed a small bowel obstruction and a mass in the cecum. The patient did have a colonoscopy that showed initially what might have been 2 lesions, a  nonobstructing lesion in the hepatic flexure and then obstructing lesion of the cecum, but on pathology this was all 1 lesion.   On admission, the hemoglobin was 4.9, hematocrit 17.6, MCV 54, the platelet count was 738,000 and white count was 15.5. Albumin was 2.8, otherwise normal electrolytes, normal creatinine, normal liver function tests.   The patient was transfused and had workup with colonoscopy and then was brought to surgery. The surgery proceeded uneventfully, the tumor was an obstructing lesion in the cecum that was extending into the retroperitoneum, some of the psoas muscle was taken to remove the tumor. The surgeon did not see any gross liver involvement.   Pathology results showed a T4a lesion through the visceral peritoneum through the serosa, there was tumor on the psoas muscle but not invading into psoas muscle. Pathologically T3b tumor but clinically radiographically, based on CT appearance of the liver, this is a M1 tumor with at least 3 lesions in the liver and the greatest was 1.1 mm that are felt to be positive for metastatic disease.   Recent laboratory results, the creatinine was 0.71, the potassium is normal. The white count is 8.8 with 7.1 neutrophils, hemoglobin is 10.7 and platelets of 433,000. The CEA preoperatively was documented at 25.3.   IMPRESSION: The patient is status post surgery for an obstructing lesion, colorectal adenocarcinoma of the cecum. There is no gross tumor left behind in the retroperitoneum or pelvis, the tumor was adherent to  portion of psoas muscle was taken, but there was no muscle invasion on the pathological specimen. Staging by CT of abdomen and pelvis  has shown what is stage IV disease with at least 3 lesions in the liver. I reviewed the scans in tumor conference and directly with radiology. The patient has made a good recovery from surgery. Hemoglobin is at a good level. There is no other history other significant medical issues. As stated, there  is no disease in the chest by chest x-ray, but CT of the chest has not been done.  PLAN: Discussed these issues with the patient on 2 occasions with daughter in one and son on another. Initially the patient will just recover from surgery. The patient was explained the meaning of stage IV cancer. It was further explained that in order to confirm the findings on CT, it is recommended to have a PET scan in approximately 3 weeks which would also give views of the lung, lymph nodes and bones that were not previously imaged plus another follow-up view of the liver to confirm what is clinically apparent. Distinction between confirming stage IV disease versus stage III-B is very significant as then the patient would be treated for metastatic disease rather than adjuvant therapy. I explained about chemotherapy and 5-FU/oxaliplatin based treatments and placement of a [place]. I further discussed that if the patient did not have metastatic disease we would also want to give radiation, but that is not currently the plan unless follow-up scans show surprise findings. At this time, the patient is undecided about whether or not she ever wants further therapy. Also, the patient may be moving to live with her [place] in [place]. She may seek medical attention closer to home and may not [facility] care.   SUMMARY AND PLAN: The patient has stage IV disease on CT scan. The plan is to let her recover and in 2 to 3 weeks have a follow-up with oncology. Follow-up plans would first include repeating a CEA and getting a PET scan. Then treatment for metastatic disease if and when this is confirmed. The patient and family may transfer their care to [place], [place] or another place closer to [place] or they may come back for treatment in [place]. I would recommend an appointment with me in approximately 2 weeks or 3 weeks post discharge in the Cancer Center to follow up these  issues. ____________________________ Knute Neuobert G. Lorre NickGittin, MD rgg:sb D: 08/11/2014 09:00:00 ET T: 08/11/2014 11:51:01 ET JOB#: [id number]  cc: Knute Neuobert G. Lorre NickGittin, MD, <Dictator> Marin RobertsOBERT G GITTIN MD ELECTRONICALLY SIGNED 09/28/2014 1:06

## 2015-02-21 DEATH — deceased

## 2016-04-30 IMAGING — CT CT ABD-PELV W/ CM
2 of 5 series · 13 of 46 positions shown, 15 images · IV contrast (agent unspecified)
Comparison: None

CLINICAL DATA: Elevated white blood cell count. Low hemoglobin.
Surgical history of appendectomy and hysterectomy. Nausea and
vomiting for 2 days.

EXAM:
CT ABDOMEN AND PELVIS WITH CONTRAST
TECHNIQUE: Multidetector CT imaging of the abdomen and pelvis was performed
using the standard protocol following bolus administration of
intravenous contrast.
CONTRAST:  75 mL [id number]

[Series 2: routine abd pel with · axial · 0.58mm/px · z∈[-412,-2]mm · 10 of 92 slices shown, 12 images]
[im 5/92  soft-tissue]
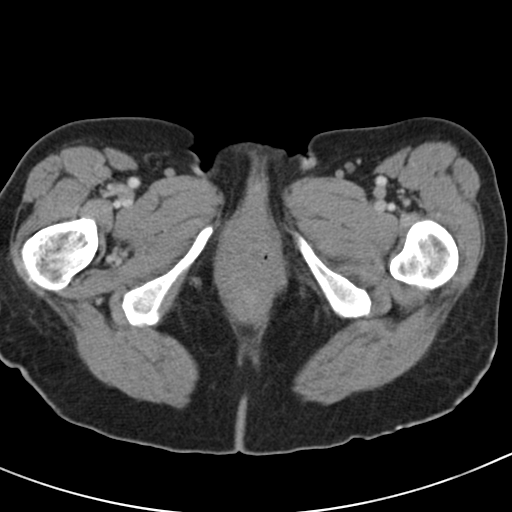
[im 5/92  bone]
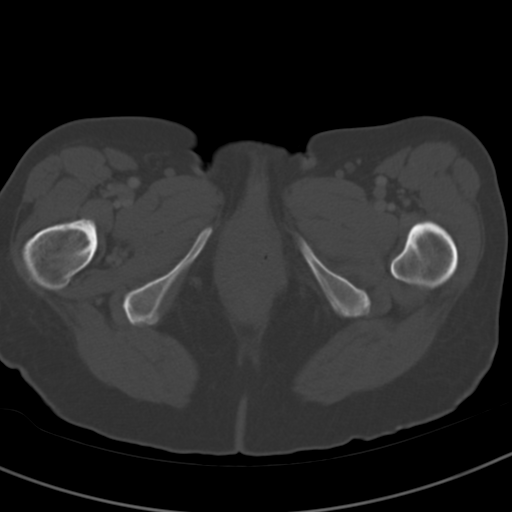
[im 14/92  soft-tissue]
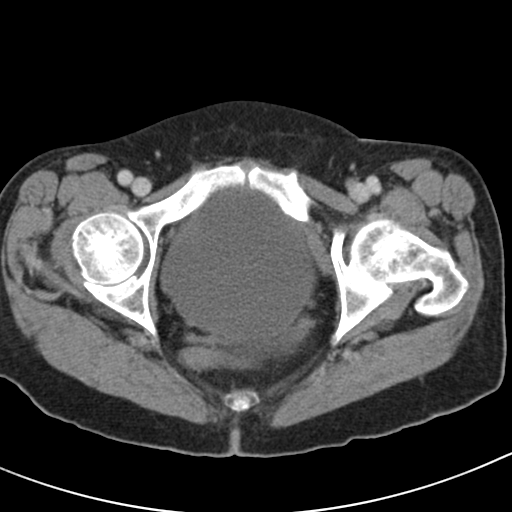
[im 23/92  soft-tissue]
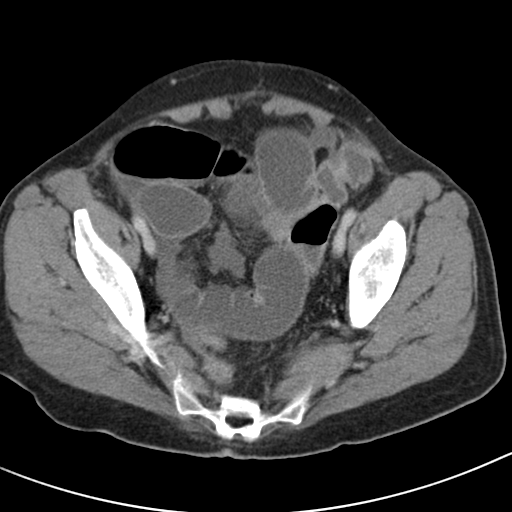
[im 32/92  soft-tissue]
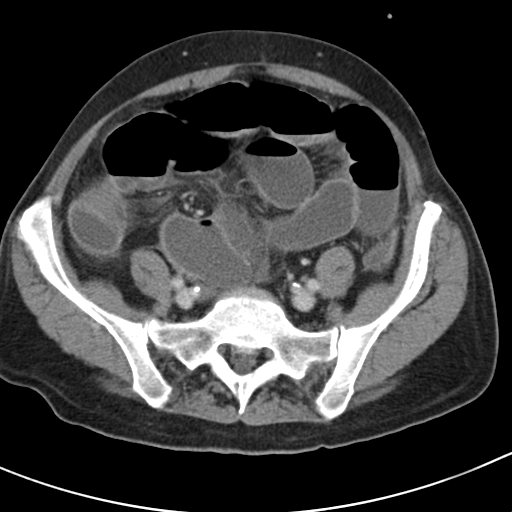
[im 41/92  soft-tissue]
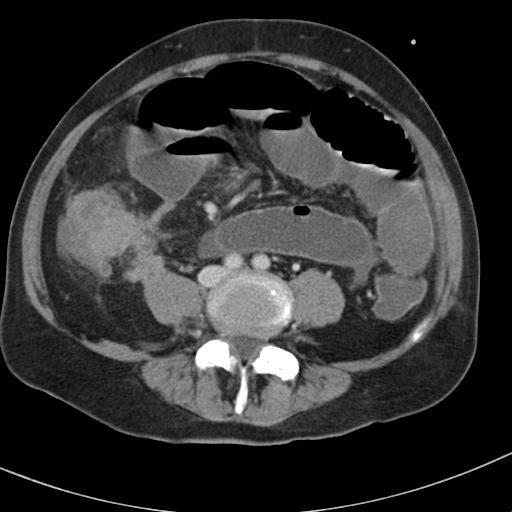
[im 51/92  soft-tissue]
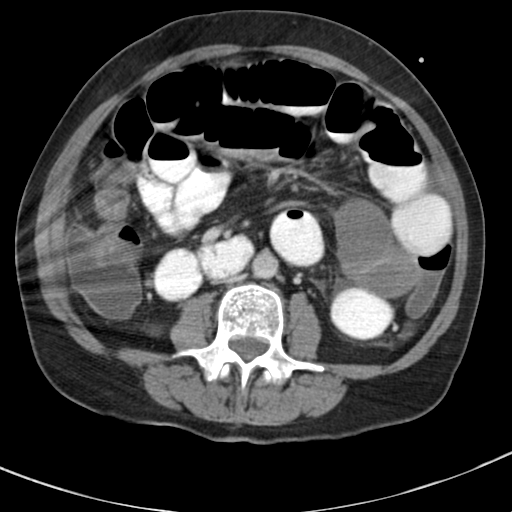
[im 60/92  soft-tissue]
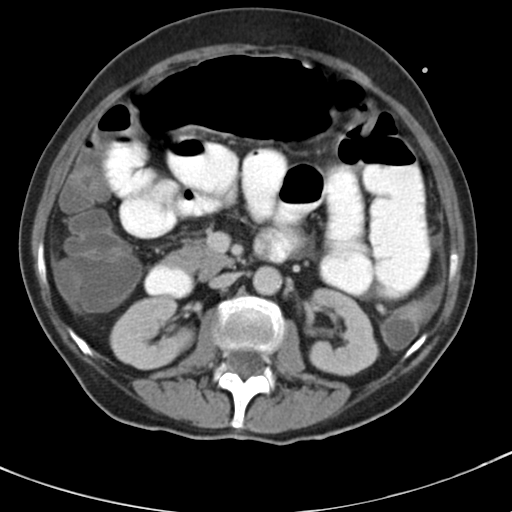
[im 69/92  soft-tissue]
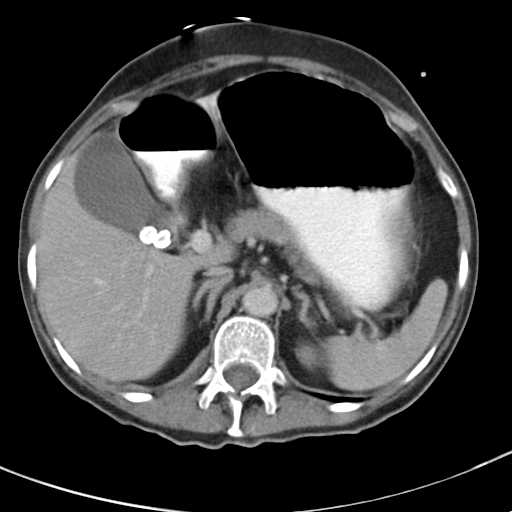
[im 78/92  soft-tissue]
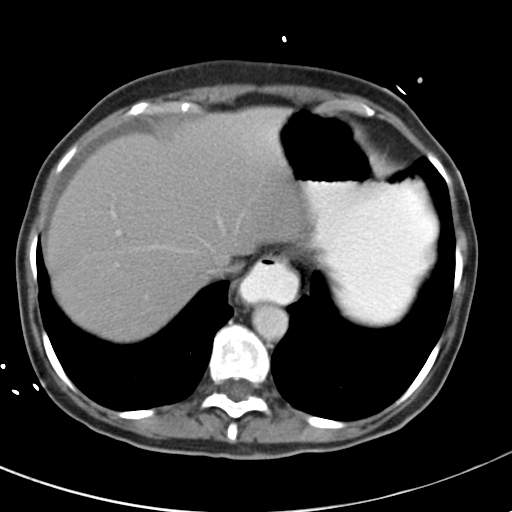
[im 78/92  bone]
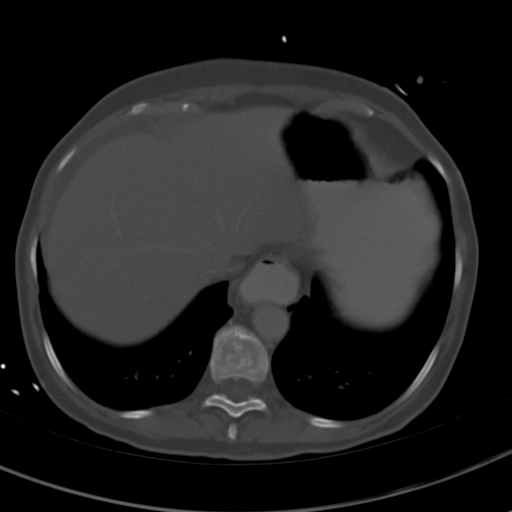
[im 87/92  soft-tissue]
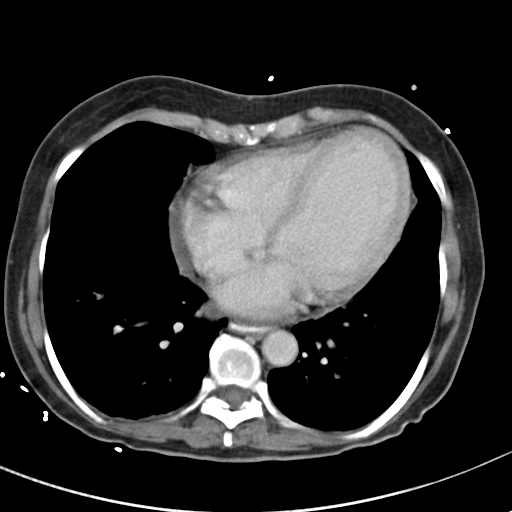

[Series 5: cor routine abd pel with · coronal · 0.53mm/px · 3 of 126 slices shown]
[im 42/126  soft-tissue]
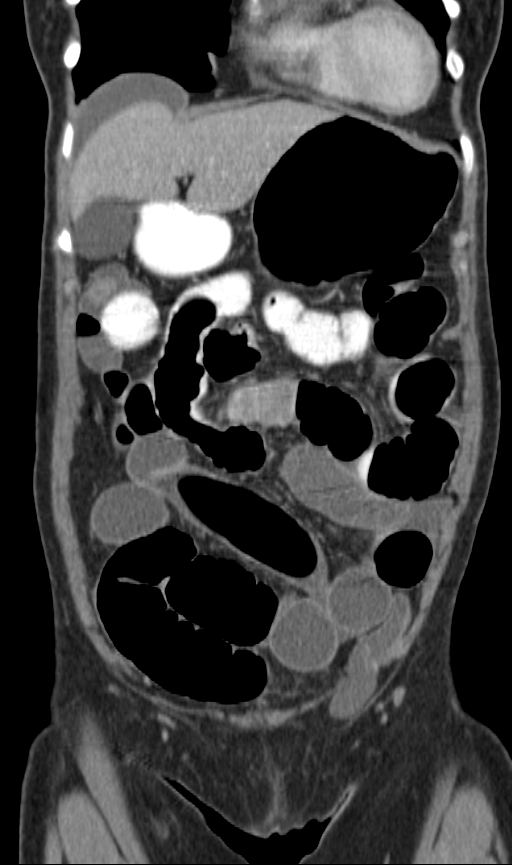
[im 56/126  soft-tissue]
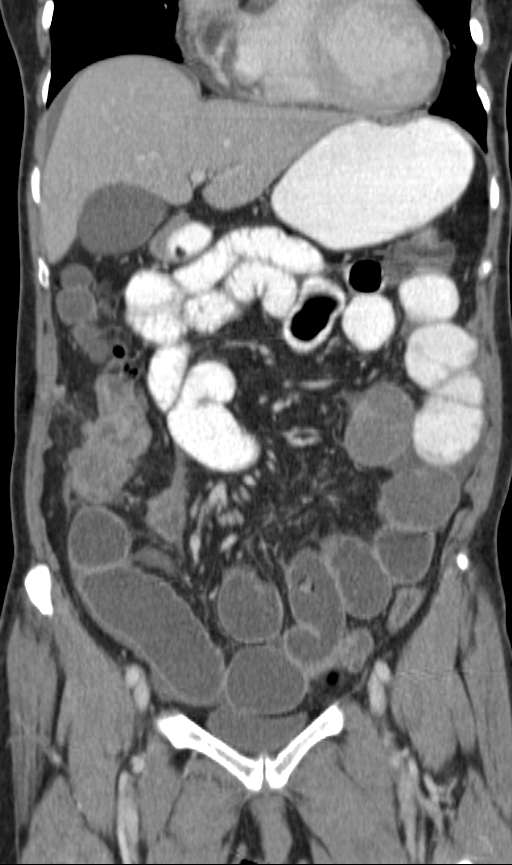
[im 70/126  soft-tissue]
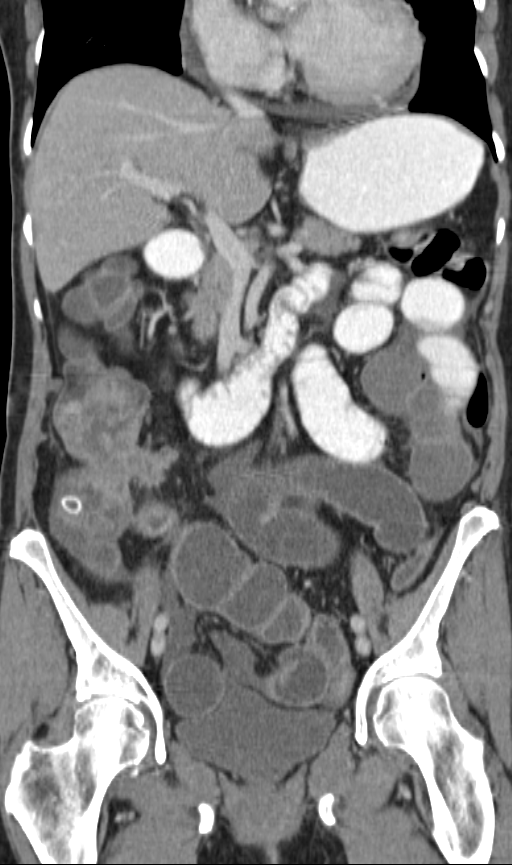

[13 of 46 positions shown; findings below may reference images not displayed]

FINDINGS: Lower chest:  Lung bases are clear.  No pericardial fluid.

Hepatobiliary: Low-density lesion in the posterior right hepatic
lobe (segment 6) measuring 8 mm on image 23, series 2. Adjacent
smaller lesion measures 6 mm on image 22. There are 2 large
gallstones within the lumen of the gallbladder measuring 10 mm each.
There is no gallbladder inflammation.

Pancreas: Pancreas is normal.

Spleen: Normal spleen.

Adrenals/urinary tract: Adrenal glands and kidneys are normal.
Ureters and bladder are normal.

Stomach/Bowel: There is a large volume of contrast within the
stomach. Contrast refluxes through the diaphragm into a moderate
sized hiatal hernia. There is dilatation of the proximal small
bowel. There is poor progression of the oral contrast through the
small bowel. The distal small bowel is fluid-filled. Dilated loops
of small bowel measure up to 3 cm.

Within the right lower quadrant there is irregular nodular
thickening above the cecum. This is at the level of the terminal
ileum. The small bowel is dilated right up to the terminal ileum.
This thickening can be seen on coronal image 53, series 2. This
thickened tissue is ill-defined and measure approximate 2.5 x 2.5 cm
on image 53, series 2. There are 2 calcifications within the tip of
the cecum measuring 15 and 8 mm on image 55. The abnormal soft
tissue thickening extends along the medial border of the ascending
colon on image 47, series 2. There an extra luminal component to
this abnormal tissue in the right lower quadrant on image 53
measuring 2.3 cm. There is fluid in the ascending colon. The
transverse colon is difficult to define descending colon is
fluid-filled the or rectosigmoid colon is predominantly collapsed.

Abdominal aorta is normal caliber

Vascular/Lymphatic: Abdominal aorta is normal caliber. There is no
retroperitoneal or periportal lymphadenopathy. No pelvic
lymphadenopathy. No peritoneal disease.

Reproductive: Post hysterectomy anatomy. No free fluid the pelvis
per

Musculoskeletal: No aggressive osseous lesion.

Other: No intraperitoneal free air.  No pneumatosis
IMPRESSION: 1. Mechanical small bowel obstruction with obstruction occurring at
the terminal ileum. This is not high-grade acute obstruction but
more slowly progressive.
2. Irregular thickened tissue at the terminal ileum extending along
the medial border this cecum is concerning for a neoplastic process.
Colorectal carcinoma carcinoma is primary consideration. Appendiceal
carcinoma would be a secondary consideration. Prior appendectomy.
3. Two calcifications within the cecum. Do not favor these to
represent gallstones as there is no gas in the gallbladder to
suggest gallstones ileus pattern.
4. Collapsed colon consistent with obstruction.
5. Two low-density lesion in the right hepatic lobe are
indeterminate but are concerning for hepatic metastasis in the face
of the potential neoplastic process in the cecum.
6. Management strategy may include NG tube to decompress the bowel
and colonoscopy to evaluate the cecum.
